# Patient Record
Sex: Female | Born: 1985 | Race: White | Hispanic: No | Marital: Married | Smoking: Never smoker
Health system: Southern US, Community
[De-identification: ages and names within clinical notes are randomized; demographics above are authoritative.]

## PROBLEM LIST (undated history)

## (undated) ENCOUNTER — Emergency Department

## (undated) DIAGNOSIS — Z789 Other specified health status: Secondary | ICD-10-CM

## (undated) HISTORY — PX: OTHER SURGICAL HISTORY: SHX169

## (undated) HISTORY — PX: TONSILLECTOMY: SUR1361

---

## 2017-03-04 ENCOUNTER — Encounter (HOSPITAL_COMMUNITY)
Admission: EM | Disposition: A | Discharge status: Home / Self Care | Payer: Self-pay | Source: Home / Self Care | Attending: Obstetrics and Gynecology

## 2017-03-04 ENCOUNTER — Encounter (HOSPITAL_BASED_OUTPATIENT_CLINIC_OR_DEPARTMENT_OTHER): Payer: Self-pay | Admitting: Emergency Medicine

## 2017-03-04 ENCOUNTER — Inpatient Hospital Stay (HOSPITAL_BASED_OUTPATIENT_CLINIC_OR_DEPARTMENT_OTHER)
Admission: EM | Admit: 2017-03-04 | Discharge: 2017-03-06 | DRG: 770 | Disposition: A | Discharge status: Home / Self Care | Payer: No Typology Code available for payment source | Attending: Obstetrics and Gynecology | Admitting: Obstetrics and Gynecology

## 2017-03-04 ENCOUNTER — Emergency Department (HOSPITAL_BASED_OUTPATIENT_CLINIC_OR_DEPARTMENT_OTHER): Payer: No Typology Code available for payment source

## 2017-03-04 DIAGNOSIS — O030 Genital tract and pelvic infection following incomplete spontaneous abortion: Principal | ICD-10-CM | POA: Diagnosis present

## 2017-03-04 DIAGNOSIS — Z3A10 10 weeks gestation of pregnancy: Secondary | ICD-10-CM

## 2017-03-04 DIAGNOSIS — Z6841 Body Mass Index (BMI) 40.0 and over, adult: Secondary | ICD-10-CM

## 2017-03-04 DIAGNOSIS — R109 Unspecified abdominal pain: Secondary | ICD-10-CM

## 2017-03-04 DIAGNOSIS — Z3490 Encounter for supervision of normal pregnancy, unspecified, unspecified trimester: Secondary | ICD-10-CM

## 2017-03-04 DIAGNOSIS — R102 Pelvic and perineal pain unspecified side: Secondary | ICD-10-CM

## 2017-03-04 DIAGNOSIS — O034 Incomplete spontaneous abortion without complication: Secondary | ICD-10-CM

## 2017-03-04 DIAGNOSIS — R509 Fever, unspecified: Secondary | ICD-10-CM

## 2017-03-04 DIAGNOSIS — O035 Genital tract and pelvic infection following complete or unspecified spontaneous abortion: Secondary | ICD-10-CM

## 2017-03-04 HISTORY — PX: DILATION AND EVACUATION: SHX1459

## 2017-03-04 HISTORY — DX: Other specified health status: Z78.9

## 2017-03-04 LAB — CBC
HCT: 40.2 % (ref 36.0–46.0)
HEMOGLOBIN: 13.4 g/dL (ref 12.0–15.0)
MCH: 29.1 pg (ref 26.0–34.0)
MCHC: 33.3 g/dL (ref 30.0–36.0)
MCV: 87.4 fL (ref 78.0–100.0)
Platelets: 283 10*3/uL (ref 150–400)
RBC: 4.6 MIL/uL (ref 3.87–5.11)
RDW: 13.3 % (ref 11.5–15.5)
WBC: 12.4 10*3/uL — ABNORMAL HIGH (ref 4.0–10.5)

## 2017-03-04 LAB — PREGNANCY, URINE: Preg Test, Ur: POSITIVE — AB

## 2017-03-04 LAB — URINALYSIS, ROUTINE W REFLEX MICROSCOPIC
Bilirubin Urine: NEGATIVE
GLUCOSE, UA: NEGATIVE mg/dL
HGB URINE DIPSTICK: NEGATIVE
Ketones, ur: 15 mg/dL — AB
Leukocytes, UA: NEGATIVE
Nitrite: NEGATIVE
PH: 6 (ref 5.0–8.0)
PROTEIN: NEGATIVE mg/dL
Specific Gravity, Urine: 1.022 (ref 1.005–1.030)

## 2017-03-04 LAB — COMPREHENSIVE METABOLIC PANEL
ALK PHOS: 47 U/L (ref 38–126)
ALT: 20 U/L (ref 14–54)
ANION GAP: 11 (ref 5–15)
AST: 26 U/L (ref 15–41)
Albumin: 4 g/dL (ref 3.5–5.0)
BUN: 14 mg/dL (ref 6–20)
CALCIUM: 8.9 mg/dL (ref 8.9–10.3)
CO2: 23 mmol/L (ref 22–32)
Chloride: 106 mmol/L (ref 101–111)
Creatinine, Ser: 0.83 mg/dL (ref 0.44–1.00)
GFR calc non Af Amer: >60 mL/min (ref >60)
Glucose, Bld: 115 mg/dL — ABNORMAL HIGH (ref 65–99)
POTASSIUM: 3.5 mmol/L (ref 3.5–5.1)
SODIUM: 140 mmol/L (ref 135–145)
Total Bilirubin: 0.6 mg/dL (ref 0.3–1.2)
Total Protein: 6.8 g/dL (ref 6.5–8.1)

## 2017-03-04 SURGERY — DILATION AND EVACUATION, UTERUS
Anesthesia: General | Site: Vagina

## 2017-03-04 MED ORDER — HYDROMORPHONE HCL 1 MG/ML IJ SOLN
1.0000 mg | Freq: Once | INTRAMUSCULAR | Status: AC
  Administered 2017-03-04: 1 mg via INTRAVENOUS
  Filled 2017-03-04: qty 1

## 2017-03-04 MED ORDER — SODIUM CHLORIDE 0.9 % IV BOLUS (SEPSIS)
1000.0000 mL | Freq: Once | INTRAVENOUS | Status: AC
  Administered 2017-03-04: 1000 mL via INTRAVENOUS

## 2017-03-04 MED ORDER — FAMOTIDINE IN NACL 20-0.9 MG/50ML-% IV SOLN: 20.0000 mg | Freq: Once | INTRAVENOUS | Status: DC

## 2017-03-04 MED ORDER — ONDANSETRON HCL 4 MG/2ML IJ SOLN
4.0000 mg | Freq: Once | INTRAMUSCULAR | Status: AC
  Administered 2017-03-04: 4 mg via INTRAVENOUS
  Filled 2017-03-04: qty 2

## 2017-03-04 MED ORDER — FAMOTIDINE IN NACL 20-0.9 MG/50ML-% IV SOLN
INTRAVENOUS | Status: AC
  Administered 2017-03-04: 20 mg
  Filled 2017-03-04: qty 50

## 2017-03-04 MED ORDER — SODIUM CHLORIDE 0.9 % IV SOLN
INTRAVENOUS | Status: DC
  Administered 2017-03-04: 22:00:00 via INTRAVENOUS

## 2017-03-04 MED ORDER — PIPERACILLIN-TAZOBACTAM 3.375 G IVPB 30 MIN
3.3750 g | Freq: Once | INTRAVENOUS | Status: AC
  Administered 2017-03-04: 3.375 g via INTRAVENOUS
  Filled 2017-03-04 (×2): qty 50

## 2017-03-04 MED ORDER — ACETAMINOPHEN 325 MG PO TABS
650.0000 mg | ORAL_TABLET | Freq: Once | ORAL | Status: AC
  Administered 2017-03-04: 650 mg via ORAL
  Filled 2017-03-04: qty 2

## 2017-03-04 SURGICAL SUPPLY — 15 items
CATH ROBINSON RED A/P 16FR (CATHETERS) ×4 IMPLANT
DECANTER SPIKE VIAL GLASS SM (MISCELLANEOUS) ×4 IMPLANT
GLOVE BIOGEL PI IND STRL 6.5 (GLOVE) ×2 IMPLANT
GLOVE BIOGEL PI IND STRL 7.0 (GLOVE) ×2 IMPLANT
GLOVE BIOGEL PI INDICATOR 6.5 (GLOVE) ×2
GLOVE BIOGEL PI INDICATOR 7.0 (GLOVE) ×2
GLOVE SURG SS PI 6.0 STRL IVOR (GLOVE) ×4 IMPLANT
GOWN STRL REUS W/TWL LRG LVL3 (GOWN DISPOSABLE) ×8 IMPLANT
KIT BERKELEY 1ST TRIMESTER 3/8 (MISCELLANEOUS) ×4 IMPLANT
NS IRRIG 1000ML POUR BTL (IV SOLUTION) ×4 IMPLANT
PACK VAGINAL MINOR WOMEN LF (CUSTOM PROCEDURE TRAY) ×4 IMPLANT
PAD OB MATERNITY 4.3X12.25 (PERSONAL CARE ITEMS) ×4 IMPLANT
PAD PREP 24X48 CUFFED NSTRL (MISCELLANEOUS) ×4 IMPLANT
TOWEL OR 17X24 6PK STRL BLUE (TOWEL DISPOSABLE) ×8 IMPLANT
VACURETTE 8 RIGID CVD (CANNULA) ×4 IMPLANT

## 2017-03-04 NOTE — ED Notes (Signed)
Patient transported to Ultrasound 

## 2017-03-04 NOTE — MAU Note (Signed)
Pt to room 305 post-op

## 2017-03-04 NOTE — H&P (Signed)
Wendy Thomas is an 31 y.o. female G3P0030 s/p D&C a week ago for a 10 week missed abortion presenting today for the evaluation of abdominal pain, nausea and fevers at home. Patient also reports the massage of tissue like material a few days ago. Patient is visiting from [place].   Pertinent Gynecological History: Contraception: none DES exposure: denies Blood transfusions: none Sexually transmitted diseases: no past history Previous GYN Procedures: DNC  OB History: G3, P0030   Menstrual History: Patient's last menstrual period was 12/14/2016.    History reviewed. No pertinent past medical history.  Past Surgical History:  Procedure Laterality Date  . dnc    . TONSILLECTOMY      No family history on file.  Social History:  reports that she has never smoked. She has never used smokeless tobacco. She reports that she drinks alcohol. She reports that she does not use drugs.  Allergies: No Known Allergies  Prescriptions Prior to Admission  Medication Sig Dispense Refill Last Dose  . metroNIDAZOLE (FLAGYL) 500 MG tablet Take 500 mg by mouth 3 (three) times daily.       ROS See pertinent in HPI Blood pressure 134/84, pulse 91, temperature 99.6 F (37.6 C), temperature source Oral, resp. rate 16, height  (1.626 m), weight 235 lb 3.3 oz (106.7 kg), last menstrual period 12/14/2016, SpO2 95 %. Physical Exam GENERAL: Well-developed, well-nourished female in no acute distress.  NECK: Supple. Normal thyroid.  LUNGS: Clear to auscultation bilaterally.  HEART: Regular rate and rhythm. ABDOMEN: Soft, LLQ and suprapubic tenderness PELVIC: deferred to OR. EXTREMITIES: No cyanosis, clubbing, or edema, 2+ distal pulses.  Results for orders placed or performed during the hospital encounter of 03/04/17 (from the past 24 hour(s))  Urinalysis, Routine w reflex microscopic     Status: Abnormal   Collection Time: 03/04/17  7:09 PM  Result Value Ref Range   Color, Urine YELLOW YELLOW    APPearance CLEAR CLEAR   Specific Gravity, Urine 1.022 1.005 - 1.030   pH 6.0 5.0 - 8.0   Glucose, UA NEGATIVE NEGATIVE mg/dL   Hgb urine dipstick NEGATIVE NEGATIVE   Bilirubin Urine NEGATIVE NEGATIVE   Ketones, ur 15 (A) NEGATIVE mg/dL   Protein, ur NEGATIVE NEGATIVE mg/dL   Nitrite NEGATIVE NEGATIVE   Leukocytes, UA NEGATIVE NEGATIVE  Pregnancy, urine     Status: Abnormal   Collection Time: 03/04/17  7:09 PM  Result Value Ref Range   Preg Test, Ur POSITIVE (A) NEGATIVE  CBC     Status: Abnormal   Collection Time: 03/04/17  8:20 PM  Result Value Ref Range   WBC 12.4 (H) 4.0 - 10.5 K/uL   RBC 4.60 3.87 - 5.11 MIL/uL   Hemoglobin 13.4 12.0 - 15.0 g/dL   HCT 78.2 95.6 - 21.3 %   MCV 87.4 78.0 - 100.0 fL   MCH 29.1 26.0 - 34.0 pg   MCHC 33.3 30.0 - 36.0 g/dL   RDW 08.6 57.8 - 46.9 %   Platelets 283 150 - 400 K/uL  Comprehensive metabolic panel     Status: Abnormal   Collection Time: 03/04/17  8:20 PM  Result Value Ref Range   Sodium 140 135 - 145 mmol/L   Potassium 3.5 3.5 - 5.1 mmol/L   Chloride 106 101 - 111 mmol/L   CO2 23 22 - 32 mmol/L   Glucose, Bld 115 (H) 65 - 99 mg/dL   BUN 14 6 - 20 mg/dL   Creatinine, Ser 6.29 0.44 -  1.00 mg/dL   Calcium 8.9 8.9 - 19.1 mg/dL   Total Protein 6.8 6.5 - 8.1 g/dL   Albumin 4.0 3.5 - 5.0 g/dL   AST 26 15 - 41 U/L   ALT 20 14 - 54 U/L   Alkaline Phosphatase 47 38 - 126 U/L   Total Bilirubin 0.6 0.3 - 1.2 mg/dL   GFR calc non Af Amer >60 >60 mL/min   GFR calc Af Amer >60 >60 mL/min   Anion gap 11 5 - 15    US Ob Comp Less 14 Wks  Result Date: 03/04/2017 CLINICAL DATA:  31 y/o F; D and C after miscarriage with tenderness to palpation of the pelvis and low-grade fever. EXAM: OBSTETRIC <14 WK [place] AND TRANSVAGINAL OB US TECHNIQUE: Both transabdominal and transvaginal ultrasound examinations were performed for complete evaluation of the gestation as well as the maternal uterus, adnexal regions, and pelvic cul-de-sac. Transvaginal  technique was performed to assess early pregnancy. COMPARISON:  None. FINDINGS: Intrauterine gestational sac: None Maternal uterus/adnexae: Normal adnexa. Right ovarian corpus luteum. Persistent heterogeneous thickened appearance of the endometrium measuring 22 mm. No significant blood flow is demonstrated within the endometrium on color flow Doppler. No significant pelvic fluid. IMPRESSION: Persistent heterogeneous thickened appearance of the endometrium measuring up to 22 mm compatible with retained products of conception. No significant pelvic fluid. Normal adnexa. Electronically Signed   By: Mitzi Hansen M.D.   On: 03/04/2017 22:06   US Ob Transvaginal  Result Date: 03/04/2017 CLINICAL DATA:  31 y/o F; D and C after miscarriage with tenderness to palpation of the pelvis and low-grade fever. EXAM: OBSTETRIC <14 WK [place] AND TRANSVAGINAL OB US TECHNIQUE: Both transabdominal and transvaginal ultrasound examinations were performed for complete evaluation of the gestation as well as the maternal uterus, adnexal regions, and pelvic cul-de-sac. Transvaginal technique was performed to assess early pregnancy. COMPARISON:  None. FINDINGS: Intrauterine gestational sac: None Maternal uterus/adnexae: Normal adnexa. Right ovarian corpus luteum. Persistent heterogeneous thickened appearance of the endometrium measuring 22 mm. No significant blood flow is demonstrated within the endometrium on color flow Doppler. No significant pelvic fluid. IMPRESSION: Persistent heterogeneous thickened appearance of the endometrium measuring up to 22 mm compatible with retained products of conception. No significant pelvic fluid. Normal adnexa. Electronically Signed   By: Mitzi Hansen M.D.   On: 03/04/2017 22:06    Assessment/Plan: 31 yo s/p D&C with retained products of conception and endometritis - Plan for repeat dilatation and curettage. Risks, benefits and alternatives were explained including but not  limited to risks of bleeding, infection, uterine perforation and damage to adjacent organs. Patient verbalized understanding and all questions were answered - Patient will be admitted for IV antibiotics until 24-48 hours afebrile (Tmax 102.4 on 4/17 at 10 pm) - Plan of care explained to the patient who agrees  Birgitta Uhlir 03/04/2017, 11:46 PM

## 2017-03-04 NOTE — ED Provider Notes (Signed)
[facility] Provider Note   CSN: [id number] Arrival date & time: 03/04/17  1839   By signing my name below, I, Soijett Blue, attest that this documentation has been prepared under the direction and in the presence of Vanetta Mulders, MD. Electronically Signed: Soijett Blue, ED Scribe. 03/04/17. 10:13 PM.  History   Chief Complaint Chief Complaint  Patient presents with  . Emesis    HPI Wendy Thomas is a 31 y.o. female who presents to the Emergency Department complaining of emesis q hour onset 10 AM this morning. Pt reports associated fever, chills, LLQ abdominal pain, nausea, HA, generalized body aches, and lower back pain. Pt has tried 600 mg ibuprofen with no relief of her symptoms. She states that she had a D&C completed on 02/25/2017 at [facility] [place] due to missed miscarriage and was [redacted]w[redacted]d at the time of the procedure. Pt reports that 4 days ago she felt a burst sensation and passed blood along with a dime-sized amount of tissue. Pt notes that she was evaluated at [facility] this morning and informed to come into the ED for further evaluation of her symptoms. She states that she is in the area for a Furniture show and lives in [place]. She denies cough, diarrhea, vision change, sore throat, nasal congestion, CP, SOB, dysuria, vaginal bleeding, leg swelling, rash, bruising/bleeding easily, and any other symptoms.    The history is provided by the patient. No language interpreter was used.  Emesis   This is a new problem. The current episode started 6 to 12 hours ago. Associated symptoms include abdominal pain (LLQ), chills, a fever, headaches and myalgias. Pertinent negatives include no cough and no diarrhea.    History reviewed. No pertinent past medical history.  There are no active problems to display for this patient.   Past Surgical History:  Procedure Laterality Date  . dnc    . TONSILLECTOMY      OB History    No data available         Home Medications    Prior to Admission medications   Medication Sig Start Date End Date Taking? Authorizing Provider  metroNIDAZOLE (FLAGYL) 500 MG tablet Take 500 mg by mouth 3 (three) times daily.   Yes Historical Provider, MD    Family History No family history on file.  Social History Social History  Substance Use Topics  . Smoking status: Never Smoker  . Smokeless tobacco: Never Used  . Alcohol use Yes     Allergies   Patient has no known allergies.   Review of Systems Review of Systems  Constitutional: Positive for chills and fever.  HENT: Negative for congestion and sore throat.   Eyes: Negative for visual disturbance.  Respiratory: Negative for cough.   Cardiovascular: Negative for chest pain and leg swelling.  Gastrointestinal: Positive for abdominal pain (LLQ) and vomiting. Negative for diarrhea.  Genitourinary: Negative for dysuria and vaginal bleeding.  Musculoskeletal: Positive for back pain and myalgias.  Skin: Negative for rash.  Neurological: Positive for headaches.  Hematological: Does not bruise/bleed easily.  Psychiatric/Behavioral: Negative for confusion.     Physical Exam Updated Vital Signs BP (!) 147/92 (BP Location: Left Arm)   Pulse 95   Temp (!) 102.4 F (39.1 C) (Oral) Comment: RN Sue Lush and MD [facility] informed  Resp 16   Ht  (1.626 m)   Wt 106.7 kg   LMP 12/14/2016   SpO2 98%   BMI 40.37 kg/m   Physical Exam  Constitutional: She is oriented to person, place, and time. She appears well-developed and well-nourished.  HENT:  Head: Normocephalic and atraumatic.  Mouth/Throat: Mucous membranes are dry.  Eyes: Conjunctivae and EOM are normal. Pupils are equal, round, and reactive to light. No scleral icterus.  Neck: Neck supple.  Cardiovascular: Regular rhythm and normal heart sounds.  Tachycardia present.  Exam reveals no gallop and no friction rub.   No murmur heard. Pulmonary/Chest: Effort normal and breath  sounds normal. No respiratory distress. She has no wheezes. She has no rales.  Abdominal: Soft. Bowel sounds are decreased. There is tenderness in the left lower quadrant.  LLQ tenderness > rest of lower abdomen without guarding.  Musculoskeletal:  No swelling to ankles  Neurological: She is alert and oriented to person, place, and time. No cranial nerve deficit. She exhibits normal muscle tone. Coordination normal.  Pt able to move both sets of fingers and toes  Skin: Skin is warm and dry.  Psychiatric: She has a normal mood and affect. Her behavior is normal.  Nursing note and vitals reviewed.    ED Treatments / Results  DIAGNOSTIC STUDIES: Oxygen Saturation is 98% on RA, nl  by my interpretation.    COORDINATION OF CARE: 10:14 PM Discussed treatment plan with pt at bedside and pt agreed to plan.   Labs (all labs ordered are listed, but only abnormal results are displayed) Labs Reviewed  URINALYSIS, ROUTINE W REFLEX MICROSCOPIC - Abnormal; Notable for the following:       Result Value   Ketones, ur 15 (*)    All other components within normal limits  PREGNANCY, URINE - Abnormal; Notable for the following:    Preg Test, Ur POSITIVE (*)    All other components within normal limits  CBC - Abnormal; Notable for the following:    WBC 12.4 (*)    All other components within normal limits  COMPREHENSIVE METABOLIC PANEL - Abnormal; Notable for the following:    Glucose, Bld 115 (*)    All other components within normal limits    Results for orders placed or performed during the hospital encounter of 03/04/17  Urinalysis, Routine w reflex microscopic  Result Value Ref Range   Color, Urine YELLOW YELLOW   APPearance CLEAR CLEAR   Specific Gravity, Urine 1.022 1.005 - 1.030   pH 6.0 5.0 - 8.0   Glucose, UA NEGATIVE NEGATIVE mg/dL   Hgb urine dipstick NEGATIVE NEGATIVE   Bilirubin Urine NEGATIVE NEGATIVE   Ketones, ur 15 (A) NEGATIVE mg/dL   Protein, ur NEGATIVE NEGATIVE mg/dL    Nitrite NEGATIVE NEGATIVE   Leukocytes, UA NEGATIVE NEGATIVE  Pregnancy, urine  Result Value Ref Range   Preg Test, Ur POSITIVE (A) NEGATIVE  CBC  Result Value Ref Range   WBC 12.4 (H) 4.0 - 10.5 K/uL   RBC 4.60 3.87 - 5.11 MIL/uL   Hemoglobin 13.4 12.0 - 15.0 g/dL   HCT 09.8 11.9 - 14.7 %   MCV 87.4 78.0 - 100.0 fL   MCH 29.1 26.0 - 34.0 pg   MCHC 33.3 30.0 - 36.0 g/dL   RDW 82.9 56.2 - 13.0 %   Platelets 283 150 - 400 K/uL  Comprehensive metabolic panel  Result Value Ref Range   Sodium 140 135 - 145 mmol/L   Potassium 3.5 3.5 - 5.1 mmol/L   Chloride 106 101 - 111 mmol/L   CO2 23 22 - 32 mmol/L   Glucose, Bld 115 (H) 65 - 99 mg/dL  BUN 14 6 - 20 mg/dL   Creatinine, Ser 1.61 0.44 - 1.00 mg/dL   Calcium 8.9 8.9 - 09.6 mg/dL   Total Protein 6.8 6.5 - 8.1 g/dL   Albumin 4.0 3.5 - 5.0 g/dL   AST 26 15 - 41 U/L   ALT 20 14 - 54 U/L   Alkaline Phosphatase 47 38 - 126 U/L   Total Bilirubin 0.6 0.3 - 1.2 mg/dL   GFR calc non Af Amer >60 >60 mL/min   GFR calc Af Amer >60 >60 mL/min   Anion gap 11 5 - 15      EKG  EKG Interpretation None       Radiology US Ob Comp Less 14 Wks  Result Date: 03/04/2017 CLINICAL DATA:  31 y/o F; D and C after miscarriage with tenderness to palpation of the pelvis and low-grade fever. EXAM: OBSTETRIC <14 WK [place] AND TRANSVAGINAL OB US TECHNIQUE: Both transabdominal and transvaginal ultrasound examinations were performed for complete evaluation of the gestation as well as the maternal uterus, adnexal regions, and pelvic cul-de-sac. Transvaginal technique was performed to assess early pregnancy. COMPARISON:  None. FINDINGS: Intrauterine gestational sac: None Maternal uterus/adnexae: Normal adnexa. Right ovarian corpus luteum. Persistent heterogeneous thickened appearance of the endometrium measuring 22 mm. No significant blood flow is demonstrated within the endometrium on color flow Doppler. No significant pelvic fluid. IMPRESSION: Persistent  heterogeneous thickened appearance of the endometrium measuring up to 22 mm compatible with retained products of conception. No significant pelvic fluid. Normal adnexa. Electronically Signed   By: Mitzi Hansen M.D.   On: 03/04/2017 22:06   US Ob Transvaginal  Result Date: 03/04/2017 CLINICAL DATA:  31 y/o F; D and C after miscarriage with tenderness to palpation of the pelvis and low-grade fever. EXAM: OBSTETRIC <14 WK [place] AND TRANSVAGINAL OB US TECHNIQUE: Both transabdominal and transvaginal ultrasound examinations were performed for complete evaluation of the gestation as well as the maternal uterus, adnexal regions, and pelvic cul-de-sac. Transvaginal technique was performed to assess early pregnancy. COMPARISON:  None. FINDINGS: Intrauterine gestational sac: None Maternal uterus/adnexae: Normal adnexa. Right ovarian corpus luteum. Persistent heterogeneous thickened appearance of the endometrium measuring 22 mm. No significant blood flow is demonstrated within the endometrium on color flow Doppler. No significant pelvic fluid. IMPRESSION: Persistent heterogeneous thickened appearance of the endometrium measuring up to 22 mm compatible with retained products of conception. No significant pelvic fluid. Normal adnexa. Electronically Signed   By: Mitzi Hansen M.D.   On: 03/04/2017 22:06    Procedures Procedures (including critical care time)  CRITICAL CARE Performed by: Vanetta Mulders Total critical care time: 30 minutes Critical care time was exclusive of separately billable procedures and treating other patients. Critical care was necessary to treat or prevent imminent or life-threatening deterioration. Critical care was time spent personally by me on the following activities: development of treatment plan with patient and/or surrogate as well as nursing, discussions with consultants, evaluation of patient's response to treatment, examination of patient, obtaining history from  patient or surrogate, ordering and performing treatments and interventions, ordering and review of laboratory studies, ordering and review of radiographic studies, pulse oximetry and re-evaluation of patient's condition.   Medications Ordered in ED Medications  0.9 %  sodium chloride infusion ( Intravenous New Bag/Given 03/04/17 2216)  piperacillin-tazobactam (ZOSYN) IVPB 3.375 g (3.375 g Intravenous New Bag/Given 03/04/17 2223)  acetaminophen (TYLENOL) tablet 650 mg (650 mg Oral Given 03/04/17 2207)  sodium chloride 0.9 % bolus 1,000 mL (1,000 mLs Intravenous  New Bag/Given 03/04/17 2206)  sodium chloride 0.9 % bolus 1,000 mL (1,000 mLs Intravenous New Bag/Given 03/04/17 2216)  HYDROmorphone (DILAUDID) injection 1 mg (1 mg Intravenous Given 03/04/17 2216)  ondansetron (ZOFRAN) injection 4 mg (4 mg Intravenous Given 03/04/17 2216)    Initial Impression / Assessment and Plan / ED Course  I have reviewed the triage vital signs and the nursing notes.  Pertinent labs & imaging results that were available during my care of the patient were reviewed by me and considered in my medical decision making (see chart for details).    Patient status post D&C for miscarriage pregnancy at 9-1/2 weeks 1 week ago and [place] [facility] [place]. Patient today started with bodyaches fever lower abdominal pain. Ultrasound consistent with thickening of the endometrium as well as retained products of conception. Patient here with temp up to 102. Slightly tachycardic not hypotensive. Discussed with Dr. Ladean Raya OB/GYN on call at [facility] hospital she recommended giving her Zosyn and will have her transferred to the MAU unit. Will most likely require D&C again tonight. Patient made aware of all this. Patient treated here with antinausea medicine fluids and pain medicine.  For the temp up to 102 patient given Tylenol.    I personally performed the services described in this documentation, which was scribed in my presence. The  recorded information has been reviewed and is accurate.     Final Clinical Impressions(s) / ED Diagnoses   Final diagnoses:  Abdominal pain  Retained products of conception after miscarriage  Endometritis following abortive pregnancy    New Prescriptions New Prescriptions   No medications on file   I personally performed the services described in this documentation, which was scribed in my presence. The recorded information has been reviewed and is accurate.      Vanetta Mulders, MD 03/04/17 2229

## 2017-03-04 NOTE — ED Notes (Signed)
ED Provider at bedside. 

## 2017-03-04 NOTE — ED Notes (Signed)
Patient stated that her pain is more on her hips and she has vaginal spotting.  She stated that she had a D&C last Tuesday.

## 2017-03-04 NOTE — ED Triage Notes (Addendum)
Vomiting since 10 this morning,HA and gen body aches. Pt had DNC last Tuesday , was sent from UC just to make sure this is not a complication.

## 2017-03-04 NOTE — Anesthesia Preprocedure Evaluation (Signed)
Anesthesia Evaluation  Patient identified by MRN, date of birth, ID band Patient awake    Reviewed: Allergy & Precautions, NPO status , Patient's Chart, lab work & pertinent test results  Airway Mallampati: II  TM Distance: >3 FB Neck ROM: Full    Dental no notable dental hx.    Pulmonary neg pulmonary ROS,    Pulmonary exam normal breath sounds clear to auscultation       Cardiovascular negative cardio ROS Normal cardiovascular exam Rhythm:Regular Rate:Normal     Neuro/Psych negative neurological ROS  negative psych ROS   GI/Hepatic negative GI ROS, Neg liver ROS,   Endo/Other  negative endocrine ROSMorbid obesity  Renal/GU negative Renal ROS  negative genitourinary   Musculoskeletal negative musculoskeletal ROS (+)   Abdominal (+) + obese,   Peds negative pediatric ROS (+)  Hematology negative hematology ROS (+)   Anesthesia Other Findings   Reproductive/Obstetrics negative OB ROS                             Anesthesia Physical Anesthesia Plan  ASA: III and emergent  Anesthesia Plan: General   Post-op Pain Management:    Induction: Intravenous, Rapid sequence and Cricoid pressure planned  Airway Management Planned: Oral ETT  Additional Equipment:   Intra-op Plan:   Post-operative Plan: Extubation in OR  Informed Consent: I have reviewed the patients History and Physical, chart, labs and discussed the procedure including the risks, benefits and alternatives for the proposed anesthesia with the patient or authorized representative who has indicated his/her understanding and acceptance.   Dental advisory given  Plan Discussed with: CRNA  Anesthesia Plan Comments:         Anesthesia Quick Evaluation

## 2017-03-05 ENCOUNTER — Emergency Department (HOSPITAL_COMMUNITY): Payer: No Typology Code available for payment source | Admitting: Anesthesiology

## 2017-03-05 ENCOUNTER — Encounter (HOSPITAL_COMMUNITY): Payer: Self-pay

## 2017-03-05 DIAGNOSIS — O030 Genital tract and pelvic infection following incomplete spontaneous abortion: Secondary | ICD-10-CM | POA: Diagnosis present

## 2017-03-05 DIAGNOSIS — R102 Pelvic and perineal pain: Secondary | ICD-10-CM | POA: Diagnosis present

## 2017-03-05 DIAGNOSIS — Z6841 Body Mass Index (BMI) 40.0 and over, adult: Secondary | ICD-10-CM | POA: Diagnosis not present

## 2017-03-05 DIAGNOSIS — O034 Incomplete spontaneous abortion without complication: Secondary | ICD-10-CM | POA: Diagnosis not present

## 2017-03-05 DIAGNOSIS — O035 Genital tract and pelvic infection following complete or unspecified spontaneous abortion: Secondary | ICD-10-CM | POA: Diagnosis present

## 2017-03-05 MED ORDER — MIDAZOLAM HCL 2 MG/2ML IJ SOLN
INTRAMUSCULAR | Status: AC
  Filled 2017-03-05: qty 2

## 2017-03-05 MED ORDER — HYDROMORPHONE HCL 1 MG/ML IJ SOLN: 0.2500 mg | INTRAMUSCULAR | Status: DC | PRN

## 2017-03-05 MED ORDER — SCOPOLAMINE 1 MG/3DAYS TD PT72
MEDICATED_PATCH | TRANSDERMAL | Status: AC
  Filled 2017-03-05: qty 1

## 2017-03-05 MED ORDER — OXYCODONE HCL 5 MG/5ML PO SOLN: 5.0000 mg | Freq: Once | ORAL | Status: AC | PRN

## 2017-03-05 MED ORDER — OXYCODONE-ACETAMINOPHEN 5-325 MG PO TABS
1.0000 | ORAL_TABLET | ORAL | Status: DC | PRN
  Administered 2017-03-05 – 2017-03-06 (×2): 1 via ORAL
  Filled 2017-03-05 (×2): qty 1

## 2017-03-05 MED ORDER — PIPERACILLIN-TAZOBACTAM 3.375 G IVPB 30 MIN: 3.3750 g | Freq: Three times a day (TID) | INTRAVENOUS | Status: DC

## 2017-03-05 MED ORDER — FENTANYL CITRATE (PF) 100 MCG/2ML IJ SOLN
INTRAMUSCULAR | Status: DC | PRN
  Administered 2017-03-05: 100 ug via INTRAVENOUS

## 2017-03-05 MED ORDER — PRENATAL MULTIVITAMIN CH
1.0000 | ORAL_TABLET | Freq: Every day | ORAL | Status: DC
  Administered 2017-03-05: 1 via ORAL
  Filled 2017-03-05: qty 1

## 2017-03-05 MED ORDER — PROMETHAZINE HCL 25 MG/ML IJ SOLN: 6.2500 mg | INTRAMUSCULAR | Status: DC | PRN

## 2017-03-05 MED ORDER — ONDANSETRON HCL 4 MG/2ML IJ SOLN
INTRAMUSCULAR | Status: AC
  Filled 2017-03-05: qty 2

## 2017-03-05 MED ORDER — LACTATED RINGERS IV SOLN
INTRAVENOUS | Status: DC | PRN
  Administered 2017-03-05: 01:00:00 via INTRAVENOUS

## 2017-03-05 MED ORDER — HYDROMORPHONE HCL 1 MG/ML IJ SOLN
INTRAMUSCULAR | Status: DC | PRN
  Administered 2017-03-05: 1 mg via INTRAVENOUS

## 2017-03-05 MED ORDER — SUCCINYLCHOLINE CHLORIDE 20 MG/ML IJ SOLN
INTRAMUSCULAR | Status: DC | PRN
  Administered 2017-03-05: 120 mg via INTRAVENOUS

## 2017-03-05 MED ORDER — MEPERIDINE HCL 25 MG/ML IJ SOLN: 6.2500 mg | INTRAMUSCULAR | Status: DC | PRN

## 2017-03-05 MED ORDER — KETOROLAC TROMETHAMINE 30 MG/ML IJ SOLN
INTRAMUSCULAR | Status: DC | PRN
  Administered 2017-03-05: 30 mg via INTRAVENOUS

## 2017-03-05 MED ORDER — CHLOROPROCAINE HCL 1 % IJ SOLN
INTRAMUSCULAR | Status: DC | PRN
  Administered 2017-03-05: 8 mL

## 2017-03-05 MED ORDER — SCOPOLAMINE 1 MG/3DAYS TD PT72
MEDICATED_PATCH | TRANSDERMAL | Status: DC | PRN
  Administered 2017-03-05: 1 via TRANSDERMAL

## 2017-03-05 MED ORDER — ONDANSETRON HCL 4 MG/2ML IJ SOLN
INTRAMUSCULAR | Status: DC | PRN
  Administered 2017-03-05: 4 mg via INTRAVENOUS

## 2017-03-05 MED ORDER — LIDOCAINE HCL (CARDIAC) 20 MG/ML IV SOLN
INTRAVENOUS | Status: DC | PRN
Start: 2017-03-05 — End: 2017-03-05
  Administered 2017-03-05: 100 mg via INTRAVENOUS

## 2017-03-05 MED ORDER — HYDROMORPHONE HCL 1 MG/ML IJ SOLN
INTRAMUSCULAR | Status: AC
  Filled 2017-03-05: qty 1

## 2017-03-05 MED ORDER — PROPOFOL 10 MG/ML IV BOLUS
INTRAVENOUS | Status: AC
  Filled 2017-03-05: qty 20

## 2017-03-05 MED ORDER — KETOROLAC TROMETHAMINE 30 MG/ML IJ SOLN
INTRAMUSCULAR | Status: AC
  Filled 2017-03-05: qty 1

## 2017-03-05 MED ORDER — PROPOFOL 10 MG/ML IV BOLUS
INTRAVENOUS | Status: DC | PRN
Start: 2017-03-05 — End: 2017-03-05
  Administered 2017-03-05: 200 mg via INTRAVENOUS

## 2017-03-05 MED ORDER — PIPERACILLIN-TAZOBACTAM 3.375 G IVPB
3.3750 g | Freq: Three times a day (TID) | INTRAVENOUS | Status: AC
  Administered 2017-03-05 – 2017-03-06 (×4): 3.375 g via INTRAVENOUS
  Filled 2017-03-05 (×4): qty 50

## 2017-03-05 MED ORDER — LIDOCAINE HCL (CARDIAC) 20 MG/ML IV SOLN
INTRAVENOUS | Status: AC
  Filled 2017-03-05: qty 5

## 2017-03-05 MED ORDER — OXYCODONE HCL 5 MG PO TABS
5.0000 mg | ORAL_TABLET | Freq: Once | ORAL | Status: AC | PRN
  Administered 2017-03-05: 5 mg via ORAL
  Filled 2017-03-05: qty 1

## 2017-03-05 MED ORDER — IBUPROFEN 600 MG PO TABS
600.0000 mg | ORAL_TABLET | Freq: Four times a day (QID) | ORAL | Status: DC
  Administered 2017-03-05 – 2017-03-06 (×5): 600 mg via ORAL
  Filled 2017-03-05 (×5): qty 1

## 2017-03-05 MED ORDER — DEXAMETHASONE SODIUM PHOSPHATE 10 MG/ML IJ SOLN
INTRAMUSCULAR | Status: AC
  Filled 2017-03-05: qty 1

## 2017-03-05 MED ORDER — SUCCINYLCHOLINE CHLORIDE 200 MG/10ML IV SOSY
PREFILLED_SYRINGE | INTRAVENOUS | Status: AC
  Filled 2017-03-05: qty 10

## 2017-03-05 MED ORDER — FENTANYL CITRATE (PF) 100 MCG/2ML IJ SOLN
INTRAMUSCULAR | Status: AC
  Filled 2017-03-05: qty 2

## 2017-03-05 MED ORDER — DEXAMETHASONE SODIUM PHOSPHATE 10 MG/ML IJ SOLN
INTRAMUSCULAR | Status: DC | PRN
  Administered 2017-03-05: 10 mg via INTRAVENOUS

## 2017-03-05 MED ORDER — MIDAZOLAM HCL 5 MG/5ML IJ SOLN
INTRAMUSCULAR | Status: DC | PRN
  Administered 2017-03-05: 2 mg via INTRAVENOUS

## 2017-03-05 NOTE — Addendum Note (Signed)
Addendum  created 03/05/17 1037 by Algis Greenhouse, CRNA   Charge Capture section accepted

## 2017-03-05 NOTE — Transfer of Care (Signed)
Immediate Anesthesia Transfer of Care Note  Patient: Rynlee Lisbon  Procedure(s) Performed: Procedure(s): DILATATION AND EVACUATION (N/A)  Patient Location: PACU  Anesthesia Type:General  Level of Consciousness: awake, alert  and oriented  Airway & Oxygen Therapy: Patient Spontanous Breathing and Patient connected to nasal cannula oxygen  Post-op Assessment: Report given to RN and Post -op Vital signs reviewed and stable  Post vital signs: Reviewed and stable  Last Vitals:  Vitals:   03/04/17 2237 03/04/17 2330  BP: 134/84 127/75  Pulse: 91 88  Resp: 16 18  Temp: 37.6 C     Last Pain:  Vitals:   03/04/17 2330  TempSrc:   PainSc: 4          Complications: No apparent anesthesia complications

## 2017-03-05 NOTE — Op Note (Addendum)
PROCEDURE DATE: 03/05/2017  PREOPERATIVE DIAGNOSIS: retained products of conception and endometritis POSTOPERATIVE DIAGNOSIS: The same. PROCEDURE:     Dilation and Evacuation. SURGEON:  Dr. Jolayne Panther  INDICATIONS: 31 y.o. yo G3P0030 with s/p D&C 1 week ago for missed abortion with retained products of conception and endometritis , needing surgical completion.  Risks of surgery were discussed with the patient including but not limited to: bleeding which may require transfusion; infection which may require antibiotics; injury to uterus or surrounding organs;need for additional procedures including laparotomy or laparoscopy; possibility of intrauterine scarring which may impair future fertility; and other postoperative/anesthesia complications. Written informed consent was obtained.    FINDINGS:  A 8-week anteverted uterus, moderate amounts of products of conception, specimen sent to pathology.  ANESTHESIA:    Monitored intravenous sedation, paracervical block. INTRAVENOUS FLUIDS:  2000 ml of LR ESTIMATED BLOOD LOSS:  Less than 20 ml. SPECIMENS:  Products of conception sent to pathology COMPLICATIONS:  None immediate.  PROCEDURE DETAILS:  The patient received intravenous antibiotics prior to transfer to [facility].  She was then taken to the operating room where general anesthesia was administered and was found to be adequate.  After an adequate timeout was performed, she was placed in the dorsal lithotomy position and examined; then prepped and draped in the sterile manner.   Her bladder was catheterized for an unmeasured amount of clear, yellow urine. A vaginal speculum was then placed in the patient's vagina and a single tooth tenaculum was applied to the anterior lip of the cervix.  A paracervical block using 1% Marcaine was administered. The cervix was gently dilated through serial insertion of Hagar dilators to accommodate an 8 mm suction curved curette that was  gently advanced to the uterine fundus.  The suction device was then activated and curette slowly rotated to clear the uterus of products of conception.  A sharp curettage was then performed to confirm complete emptying of the uterus.There was minimal bleeding noted and the tenaculum removed with good hemostasis noted.  The patient tolerated the procedure well.  The patient was taken to the recovery area in stable condition.

## 2017-03-05 NOTE — Progress Notes (Signed)
Subjective: Patient reports lower pelvic pain improved since admission.  She denies chest pan, SOB, lightheadedness/dizziness  Objective: I have reviewed patient's vital signs.  General: alert, cooperative and no distress Resp: clear to auscultation bilaterally Cardio: regular rate and rhythm GI: soft, mild lower abdominal tenderness, no rebound, no guarding Extremities: extremities normal, atraumatic, no cyanosis or edema and no edema, redness or tenderness in the calves or thighs   Assessment/Plan: 31 yo POD#1 s/p D&C for retained POC and endometritis - Continue zosyn until 16109 hour afebrile (tmax 102.4 4/17 at 10pm) - pain management as needed - continue current care  LOS: 0 days    Wendy Thomas 03/05/2017, 6:48 AM

## 2017-03-05 NOTE — Anesthesia Postprocedure Evaluation (Signed)
Anesthesia Post Note  Patient: Wendy Thomas  Procedure(s) Performed: Procedure(s) (LRB): DILATATION AND EVACUATION (N/A)  Patient location during evaluation: PACU Anesthesia Type: General Level of consciousness: awake and alert Pain management: pain level controlled Vital Signs Assessment: post-procedure vital signs reviewed and stable Respiratory status: spontaneous breathing, nonlabored ventilation and respiratory function stable Cardiovascular status: blood pressure returned to baseline and stable Postop Assessment: no signs of nausea or vomiting Anesthetic complications: no        Last Vitals:  Vitals:   03/05/17 0045 03/05/17 0100  BP: 130/81 122/74  Pulse: 77 78  Resp: 18 16  Temp:      Last Pain:  Vitals:   03/05/17 0040  TempSrc:   PainSc: 0-No pain   Pain Goal:                 Lowella Curb

## 2017-03-05 NOTE — Anesthesia Procedure Notes (Signed)
Procedure Name: Intubation Date/Time: 03/05/2017 12:11 AM Performed by: Junious Silk Pre-anesthesia Checklist: Patient identified, Emergency Drugs available, Suction available, Patient being monitored and Timeout performed Patient Re-evaluated:Patient Re-evaluated prior to inductionOxygen Delivery Method: Circle system utilized Preoxygenation: Pre-oxygenation with 100% oxygen Intubation Type: IV induction, Combination inhalational/ intravenous induction, Rapid sequence and Cricoid Pressure applied Laryngoscope Size: Miller and 2 Grade View: Grade I Tube type: Oral Tube size: 7.0 mm Number of attempts: 1 Airway Equipment and Method: Stylet Placement Confirmation: ETT inserted through vocal cords under direct vision,  positive ETCO2 and breath sounds checked- equal and bilateral Secured at: 21 cm Tube secured with: Tape Dental Injury: Teeth and Oropharynx as per pre-operative assessment

## 2017-03-06 LAB — CBC
HCT: 33.7 % — ABNORMAL LOW (ref 36.0–46.0)
Hemoglobin: 11.1 g/dL — ABNORMAL LOW (ref 12.0–15.0)
MCH: 29.1 pg (ref 26.0–34.0)
MCHC: 32.9 g/dL (ref 30.0–36.0)
MCV: 88.2 fL (ref 78.0–100.0)
PLATELETS: 272 10*3/uL (ref 150–400)
RBC: 3.82 MIL/uL — ABNORMAL LOW (ref 3.87–5.11)
RDW: 13.8 % (ref 11.5–15.5)
WBC: 9.7 10*3/uL (ref 4.0–10.5)

## 2017-03-06 MED ORDER — CIPROFLOXACIN HCL 500 MG PO TABS: 500.0000 mg | ORAL_TABLET | Freq: Two times a day (BID) | ORAL | 0 refills | Status: AC

## 2017-03-06 MED ORDER — IBUPROFEN 600 MG PO TABS: 600.0000 mg | ORAL_TABLET | Freq: Four times a day (QID) | ORAL | 0 refills | Status: AC

## 2017-03-06 MED ORDER — OXYCODONE-ACETAMINOPHEN 5-325 MG PO TABS: 1.0000 | ORAL_TABLET | ORAL | 0 refills | Status: AC | PRN

## 2017-03-06 NOTE — Discharge Summary (Signed)
Physician Discharge Summary  Patient ID: Wendy Thomas MRN: [id number] DOB/AGE: [birth year] 31 y.o.  Admit date: 03/04/2017 Discharge date: 03/06/2017  Admission Diagnoses: Endometritis following operative procedure for 10 week missed AB  Discharge Diagnoses:  Active Problems:   Endometritis following abortive pregnancy   Retained products of conception following abortion   Discharged Condition: stable  Hospital Course: pt was admitted with endometritis and retained products of conception.  Admitted placed on zosyn and underwent a subsequent D&C.  Responded well with stable blood counts and defervescence. Discharged on cipro  Consults: None  Significant Diagnostic Studies: labs:   Treatments: antibiotics: Zosyn, D&C  Discharge Exam: Blood pressure (!) 103/57, pulse (!) 56, temperature 98.4 F (36.9 C), resp. rate 15, height  (1.626 m), weight 235 lb 3.3 oz (106.7 kg), last menstrual period 12/14/2016, SpO2 98 %. General appearance: alert, cooperative and no distress GI: soft, non-tender; bowel sounds normal; no masses,  no organomegaly  Disposition: Final discharge disposition not confirmed  Discharge Instructions    Call MD for:  persistant nausea and vomiting    Complete by:  As directed    Call MD for:  severe uncontrolled pain    Complete by:  As directed    Call MD for:  temperature >100.4    Complete by:  As directed    Diet - low sodium heart healthy    Complete by:  As directed    Driving Restrictions    Complete by:  As directed    No driving for 72 hours   Increase activity slowly    Complete by:  As directed    Lifting restrictions    Complete by:  As directed    Do not lift more than 10 pounds   No wound care    Complete by:  As directed    Sexual Activity Restrictions    Complete by:  As directed    No sex for 4 weeks     Allergies as of 03/06/2017   No Known Allergies     Medication List    TAKE these medications   ciprofloxacin 500 MG  tablet Commonly known as:  CIPRO Take 1 tablet (500 mg total) by mouth 2 (two) times daily.   ibuprofen 600 MG tablet Commonly known as:  ADVIL,MOTRIN Take 1 tablet (600 mg total) by mouth 4 (four) times daily.   oxyCODONE-acetaminophen 5-325 MG tablet Commonly known as:  PERCOCET/ROXICET Take 1-2 tablets by mouth every 3 (three) hours as needed (moderate to severe pain (when tolerating fluids)).   prenatal multivitamin Tabs tablet Take 1 tablet by mouth daily at 12 noon.        SignedLazaro Arms 03/06/2017, 8:05 AM

## 2017-03-06 NOTE — Discharge Instructions (Signed)
Dilation and Curettage or Vacuum Curettage, Care After °This sheet gives you information about how to care for yourself after your procedure. Your health care provider may also give you more specific instructions. If you have problems or questions, contact your health care provider. °What can I expect after the procedure? °After your procedure, it is common to have: °· Mild pain or cramping. °· Some vaginal bleeding or spotting. °These may last for up to 2 weeks after your procedure. °Follow these instructions at home: °Activity  ° °· Do not drive or use heavy machinery while taking prescription pain medicine. °· Avoid driving for the first 24 hours after your procedure. °· Take frequent, short walks, followed by rest periods, throughout the day. Ask your health care provider what activities are safe for you. After 1-2 days, you may be able to return to your normal activities. °· Do not lift anything heavier than 10 lb (4.5 kg) until your health care provider approves. °· For at least 2 weeks, or as long as told by your health care provider, do not: °¨ Douche. °¨ Use tampons. °¨ Have sexual intercourse. °General instructions  ° °· Take over-the-counter and prescription medicines only as told by your health care provider. This is especially important if you take blood thinning medicine. °· Do not take baths, swim, or use a hot tub until your health care provider approves. Take showers instead of baths. °· Wear compression stockings as told by your health care provider. These stockings help to prevent blood clots and reduce swelling in your legs. °· It is your responsibility to get the results of your procedure. Ask your health care provider, or the department performing the procedure, when your results will be ready. °· Keep all follow-up visits as told by your health care provider. This is important. °Contact a health care provider if: °· You have severe cramps that get worse or that do not get better with  medicine. °· You have severe abdominal pain. °· You cannot drink fluids without vomiting. °· You develop pain in a different area of your pelvis. °· You have bad-smelling vaginal discharge. °· You have a rash. °Get help right away if: °· You have vaginal bleeding that soaks more than one sanitary pad in 1 hour, for 2 hours in a row. °· You pass large blood clots from your vagina. °· You have a fever that is above 100.4°F (38.0°C). °· Your abdomen feels very tender or hard. °· You have chest pain. °· You have shortness of breath. °· You cough up blood. °· You feel dizzy or light-headed. °· You faint. °· You have pain in your neck or shoulder area. °This information is not intended to replace advice given to you by your health care provider. Make sure you discuss any questions you have with your health care provider. °Document Released: 11/01/2000 Document Revised: 07/03/2016 Document Reviewed: 06/06/2016 °[employer]vier Interactive Patient Education © 2017 [employer] ° °

## 2017-03-06 NOTE — Progress Notes (Signed)
Patient discharged home with family. Discharge paperwork, home-care, follow-up appts, and prescriptions reviewed. No questions at this time.

## 2017-05-19 IMAGING — US US OB COMP LESS 14 WK
1 series · 14 of 28 positions shown · non-contrast
Comparison: None.

CLINICAL DATA: 30 y/o F; D and C after miscarriage with tenderness
to palpation of the pelvis and low-grade fever.

EXAM:
OBSTETRIC <14 WK US AND TRANSVAGINAL OB US
TECHNIQUE: Both transabdominal and transvaginal ultrasound examinations were
performed for complete evaluation of the gestation as well as the
maternal uterus, adnexal regions, and pelvic cul-de-sac.
Transvaginal technique was performed to assess early pregnancy.

[Series 1: us ob comp less 14 wk · 0.16mm/px · 14 of 40 slices shown]
[im 2/40]
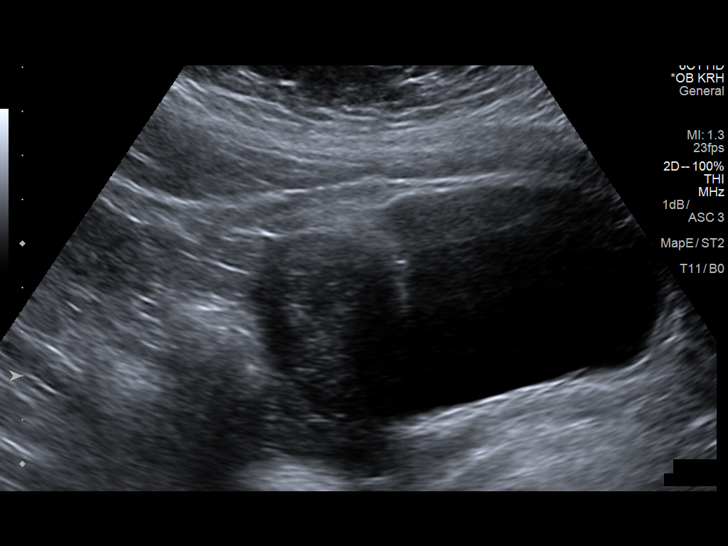
[im 5/40]
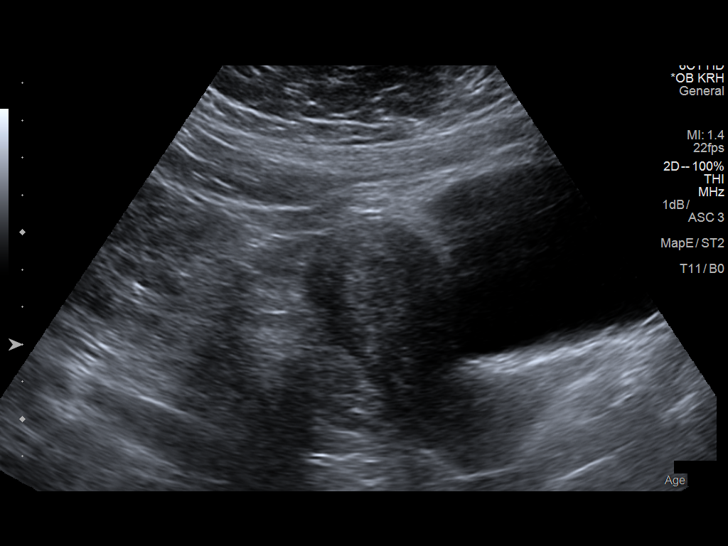
[im 8/40]
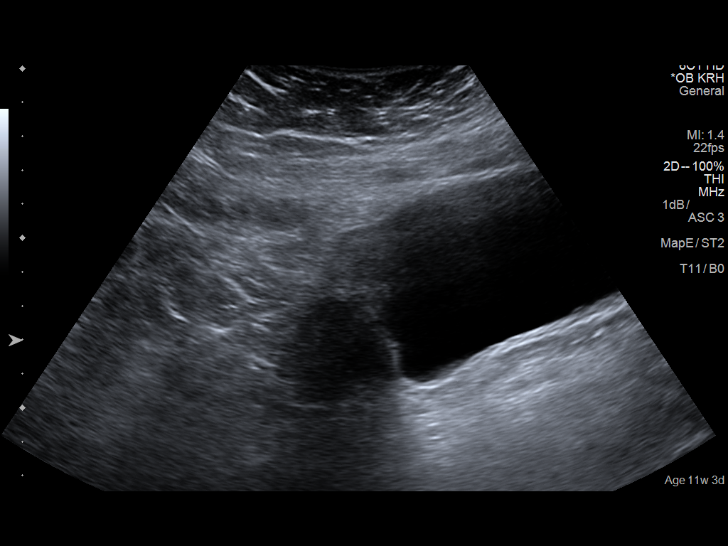
[im 11/40]
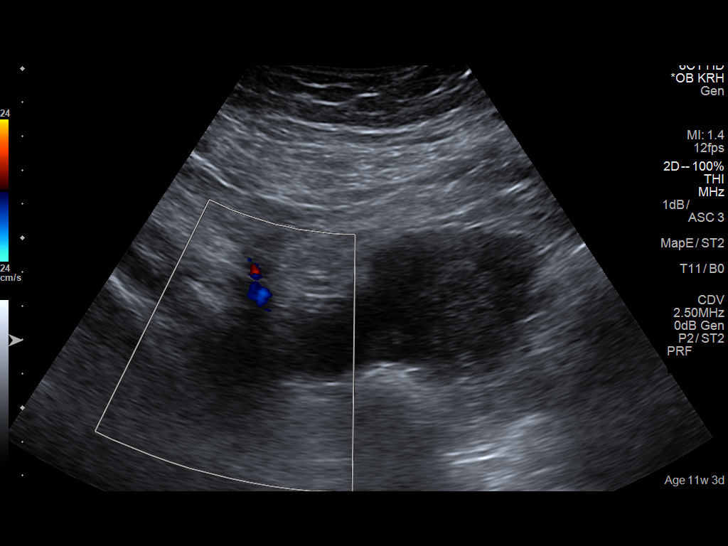
[im 14/40]
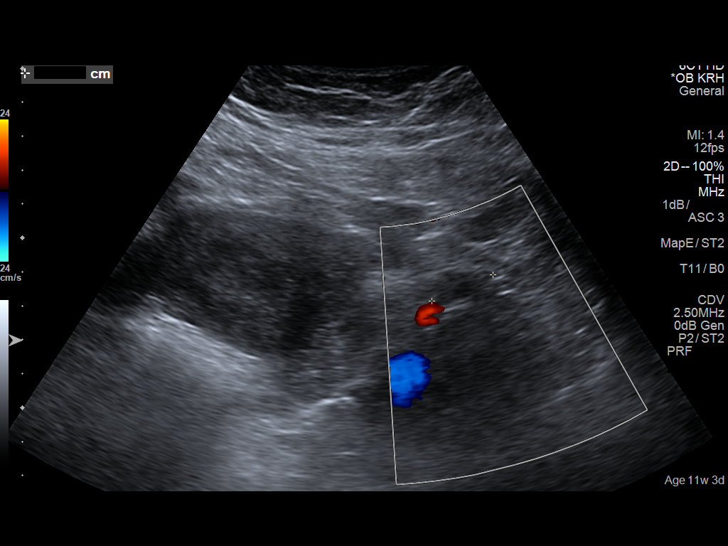
[im 16/40]
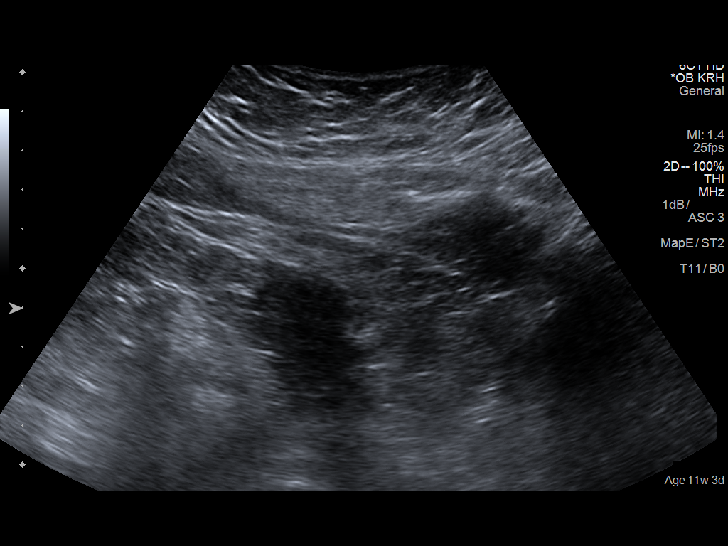
[im 19/40]
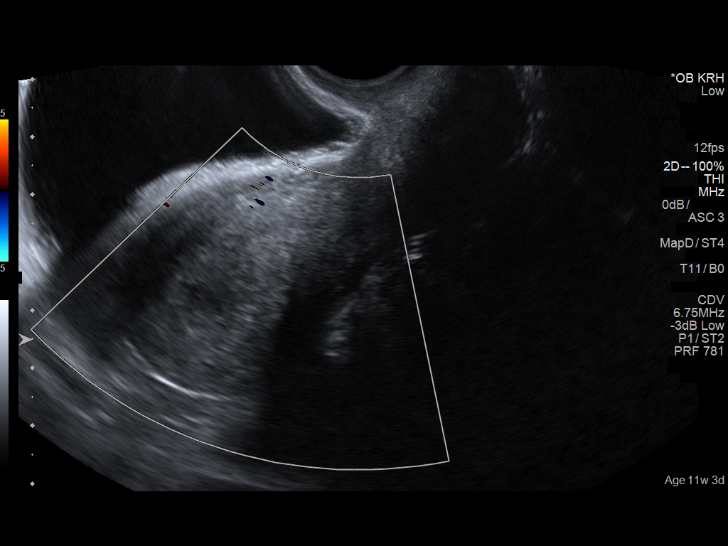
[im 22/40]
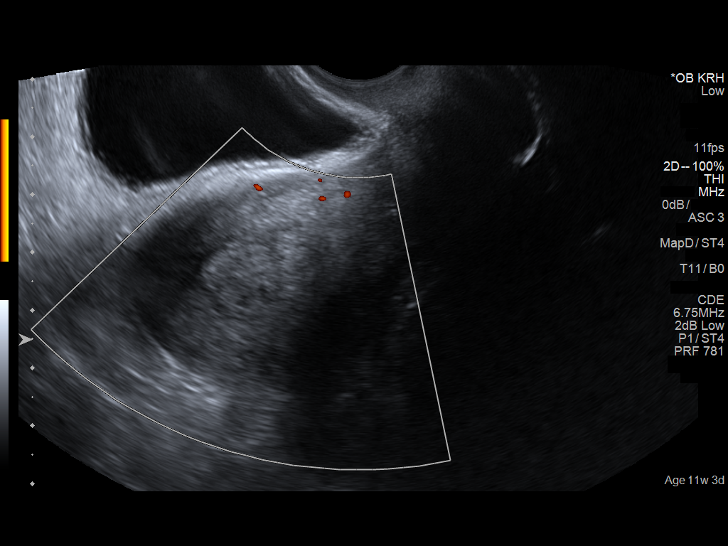
[im 25/40]
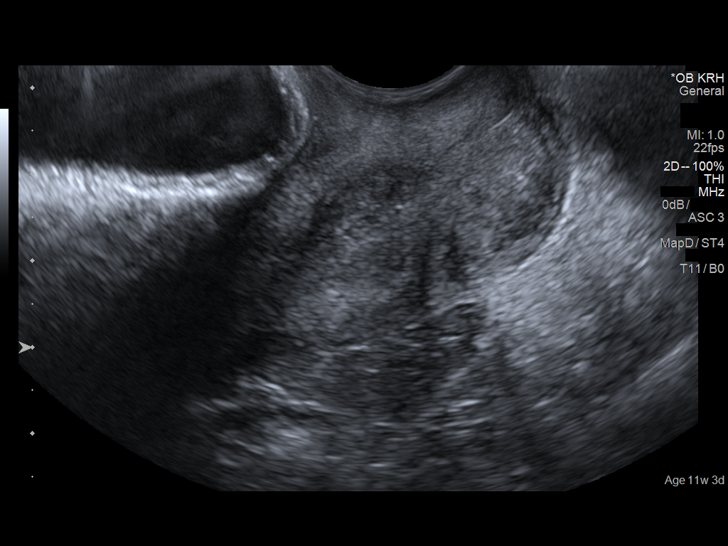
[im 28/40]
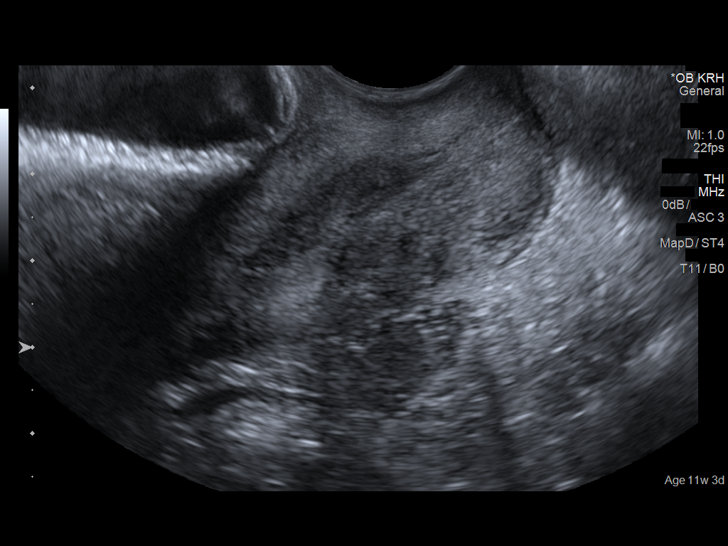
[im 31/40]
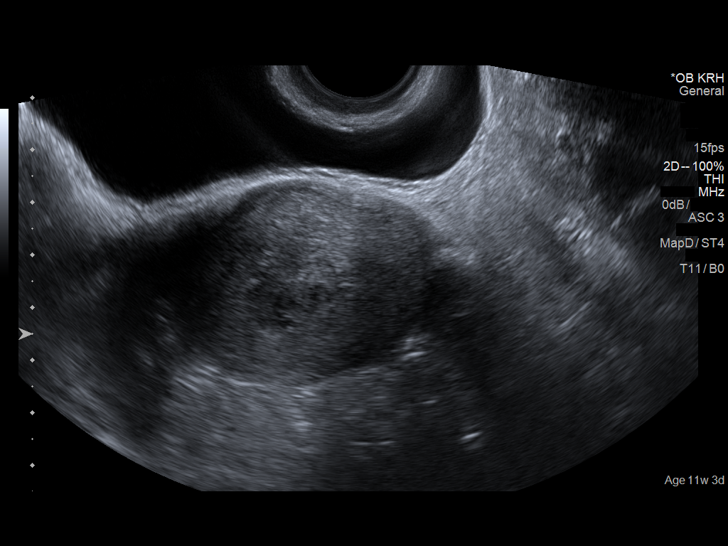
[im 34/40]
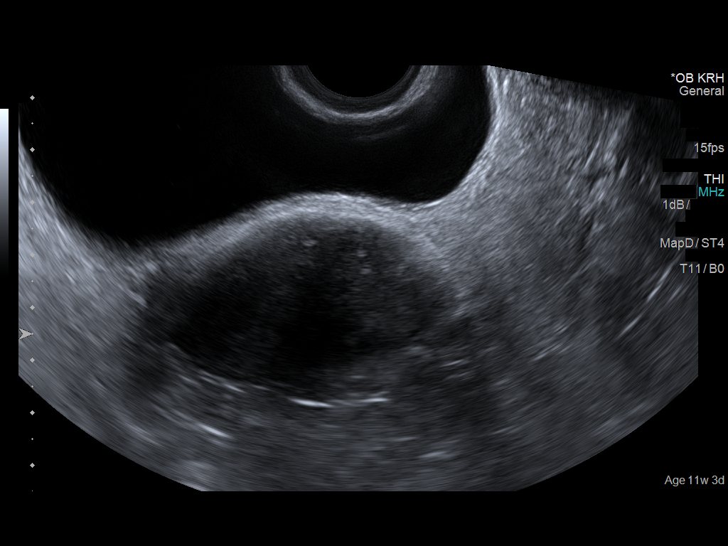
[im 37/40]
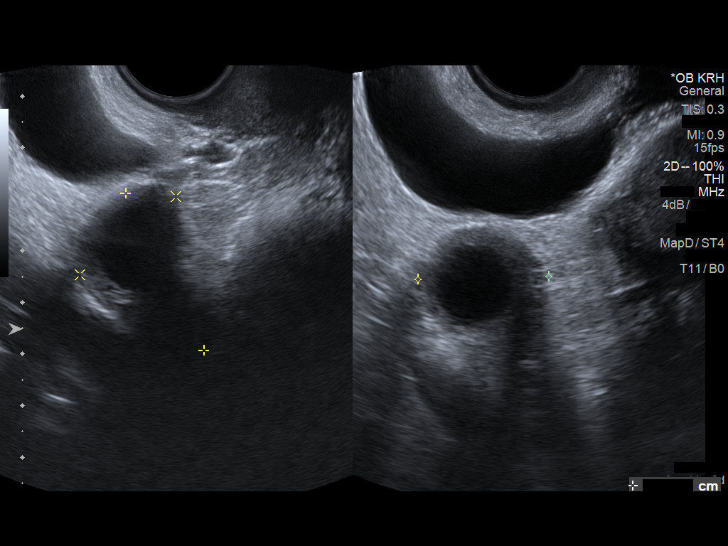
[im 40/40]
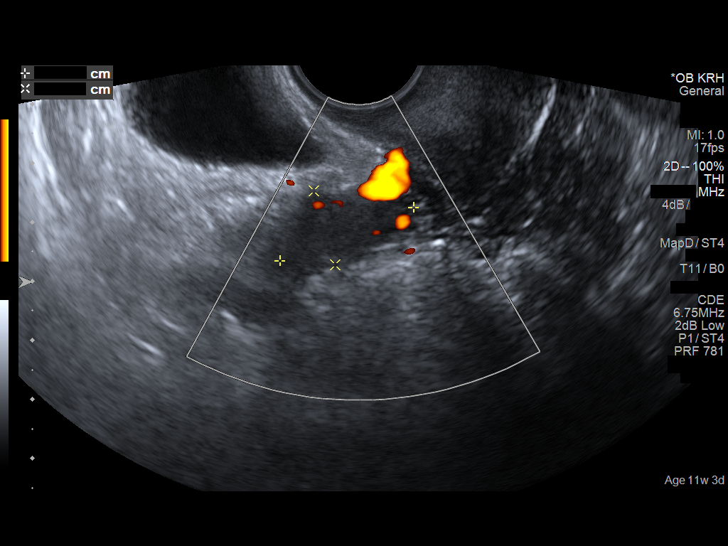

[14 of 28 positions shown; findings below may reference images not displayed]

FINDINGS: Intrauterine gestational sac: None

Maternal uterus/adnexae: Normal adnexa. Right ovarian corpus luteum.
Persistent heterogeneous thickened appearance of the endometrium
measuring 22 mm. No significant blood flow is demonstrated within
the endometrium on color flow Doppler. No significant pelvic fluid.
IMPRESSION: Persistent heterogeneous thickened appearance of the endometrium
measuring up to 22 mm compatible with retained products of
conception. No significant pelvic fluid. Normal adnexa.

By: Nushiravan Lunev M.D.
